# Patient Record
Sex: Female | Born: 1990 | Race: White | Hispanic: Yes | Marital: Single | Smoking: Current every day smoker
Health system: Southern US, Community
[De-identification: ages and names within clinical notes are randomized; demographics above are authoritative.]

## PROBLEM LIST (undated history)

## (undated) DIAGNOSIS — G43909 Migraine, unspecified, not intractable, without status migrainosus: Secondary | ICD-10-CM

## (undated) DIAGNOSIS — F32A Depression, unspecified: Secondary | ICD-10-CM

## (undated) DIAGNOSIS — F419 Anxiety disorder, unspecified: Secondary | ICD-10-CM

## (undated) DIAGNOSIS — F329 Major depressive disorder, single episode, unspecified: Secondary | ICD-10-CM

## (undated) HISTORY — PX: APPENDECTOMY: SHX54

## (undated) HISTORY — DX: Depression, unspecified: F32.A

## (undated) HISTORY — DX: Major depressive disorder, single episode, unspecified: F32.9

## (undated) HISTORY — DX: Anxiety disorder, unspecified: F41.9

## (undated) HISTORY — DX: Migraine, unspecified, not intractable, without status migrainosus: G43.909

---

## 2013-06-19 ENCOUNTER — Encounter: Payer: Self-pay | Admitting: Family Medicine

## 2013-06-19 ENCOUNTER — Ambulatory Visit (INDEPENDENT_AMBULATORY_CARE_PROVIDER_SITE_OTHER): Payer: 59 | Admitting: Family Medicine

## 2013-06-19 VITALS — Temp 98.1°F | Ht 65.75 in | Wt 130.0 lb

## 2013-06-19 DIAGNOSIS — Z23 Encounter for immunization: Secondary | ICD-10-CM

## 2013-06-19 DIAGNOSIS — G43909 Migraine, unspecified, not intractable, without status migrainosus: Secondary | ICD-10-CM

## 2013-06-19 DIAGNOSIS — Z7189 Other specified counseling: Secondary | ICD-10-CM

## 2013-06-19 DIAGNOSIS — F17200 Nicotine dependence, unspecified, uncomplicated: Secondary | ICD-10-CM

## 2013-06-19 DIAGNOSIS — Z7689 Persons encountering health services in other specified circumstances: Secondary | ICD-10-CM

## 2013-06-19 DIAGNOSIS — F341 Dysthymic disorder: Secondary | ICD-10-CM

## 2013-06-19 DIAGNOSIS — F329 Major depressive disorder, single episode, unspecified: Secondary | ICD-10-CM

## 2013-06-19 MED ORDER — SUMATRIPTAN SUCCINATE 25 MG PO TABS: ORAL_TABLET | ORAL | Status: DC

## 2013-06-19 NOTE — Patient Instructions (Signed)
-  PLEASE SIGN UP FOR MYCHART TODAY   We recommend the following healthy lifestyle measures: - eat a healthy diet consisting of lots of vegetables, fruits, beans, nuts, seeds, healthy meats such as white chicken and fish and whole grains.  - avoid fried foods, fast food, processed foods, sodas, red meet and other fattening foods.  - get a least 150 minutes of aerobic exercise per week.   -keep headache and trigger journal  -trial of medication for migraine -As we discussed, we have prescribed a new medication for you at this appointment. We discussed the common and serious potential adverse effects of this medication and you can review these and more with the pharmacist when you pick up your medication.  Please follow the instructions for use carefully and notify us immediately if you have any problems taking this medication.  -call to set up counseling  -quit smoking - try nicotine gum  Follow up in: late november

## 2013-06-19 NOTE — Progress Notes (Signed)
Chief Complaint  Patient presents with  . Establish Care  . migraines    HPI:  Cristina Flores is here to establish care. Recently moved here from [place].  Last PCP and physical: has been a while since she saw a doctor  Has the following chronic problems and concerns today:  1)Migraines: -has had these for years -gets these about 4 times per month -usually starts with odd sensation then R sided, sometimes with nausea, occ vomiting, sensitivity to light, touch and loud sounds -usually takes tylenol - doesn't take often - does help but she doesn't like to take medications -denies: fevers, chills, symptoms when not having migraines, has never taken migraine medication  2)Depression and Anxiety: -has struggled on and off with mood for a few years -depressed mood, anhedonia, tearful frequently, feels hopeless at time, poor focus at time, anxiety at times -wants to do counseling - never has SI or thoughts of self harm  3)Tobacco Use: -1 cig every 2-3 days -reasons to quit: thinks it is a nasty habit -relieves stress though  There are no active problems to display for this patient.  Health Maintenance: -wants flu shot today  ROS: See pertinent positives and negatives per HPI.  Past Medical History  Diagnosis Date  . Migraines   . Depression   . Anxiety     History reviewed. No pertinent family history.  History   Social History  . Marital Status: Single    Spouse Name: N/A    Number of Children: N/A  . Years of Education: N/A   Social History Main Topics  . Smoking status: Current Some Day Smoker    Types: Cigarettes  . Smokeless tobacco: None     Comment: per patient 1 cigarette every 2 to 3 days;   . Alcohol Use: No  . Drug Use: No  . Sexual Activity: None   Other Topics Concern  . None   Social History Narrative   Work or School: starting next semester - in transition      Home Situation: lives with boyfriend who is a Animal nutritionist  Beliefs: Christian      Lifestyle: no regular exercise; diet is healthy             Current outpatient prescriptions:SUMAtriptan (IMITREX) 25 MG tablet, 1 tab at onset of migraine. May repeat in 2 hours if headache persists or recurs. No more then 2 in 48 hours., Disp: 10 tablet, Rfl: 1  EXAM:  Filed Vitals:   06/19/13 1456  Temp: 98.1 F (36.7 C)    Body mass index is 21.14 kg/(m^2).  GENERAL: vitals reviewed and listed above, alert, oriented, appears well hydrated and in no acute distress  HEENT: atraumatic, conjunttiva clear, no obvious abnormalities on inspection of external nose and ears  NECK: no obvious masses on inspection  LUNGS: clear to auscultation bilaterally, no wheezes, rales or rhonchi, good air movement  CV: HRRR, no peripheral edema  MS: moves all extremities without noticeable abnormality  PSYCH: pleasant and cooperative, no obvious depression or anxiety  NEURO: PERRLA, CN II-XII groslly intact, gait normal, finger to nose normal  ASSESSMENT AND PLAN:  Discussed the following assessment and plan:  Encounter to establish care  Migraine - Plan: SUMAtriptan (IMITREX) 25 MG tablet  Tobacco use disorder  Anxiety and depression -We reviewed the PMH, PSH, FH, SH, Meds and Allergies. -We provided refills for any medications we will prescribe as needed. -We addressed current concerns per orders  and patient instructions. -We have asked for records for pertinent exams, studies, vaccines and notes from previous providers. -We have advised patient to follow up per instructions below. -flu vaccine given today -trial of triptan for migraines after discussion risks benefits and headache journal to identify triggeres -advised gentle exercise, counseling -smoking cessation counseling >3<10 minutes - she is going to try gum -follow up 1-2 months or sooner if needed   -Patient advised to return or notify a doctor immediately if symptoms worsen or persist or  new concerns arise.  Patient Instructions  -PLEASE SIGN UP FOR MYCHART TODAY   We recommend the following healthy lifestyle measures: - eat a healthy diet consisting of lots of vegetables, fruits, beans, nuts, seeds, healthy meats such as white chicken and fish and whole grains.  - avoid fried foods, fast food, processed foods, sodas, red meet and other fattening foods.  - get a least 150 minutes of aerobic exercise per week.   -keep headache and trigger journal  -trial of medication for migraine -As we discussed, we have prescribed a new medication for you at this appointment. We discussed the common and serious potential adverse effects of this medication and you can review these and more with the pharmacist when you pick up your medication.  Please follow the instructions for use carefully and notify us immediately if you have any problems taking this medication.  -call to set up counseling  -quit smoking - try nicotine gum  Follow up in: late november      Cristina Flores R.

## 2013-06-19 NOTE — Addendum Note (Signed)
Addended by: Azucena Freed on: 06/19/2013 03:35 PM   Modules accepted: Orders

## 2013-08-01 ENCOUNTER — Encounter: Payer: 59 | Admitting: Internal Medicine

## 2013-08-01 NOTE — Progress Notes (Signed)
Document opened and reviewed for OV but appt  canceled same day .  

## 2013-08-20 ENCOUNTER — Encounter: Payer: 59 | Admitting: Family Medicine

## 2013-08-20 DIAGNOSIS — Z0289 Encounter for other administrative examinations: Secondary | ICD-10-CM

## 2013-08-20 NOTE — Progress Notes (Signed)
Error   This encounter was created in error - please disregard. 

## 2014-04-11 ENCOUNTER — Emergency Department (HOSPITAL_COMMUNITY)
Admission: EM | Admit: 2014-04-11 | Discharge: 2014-04-11 | Disposition: A | Discharge status: Home / Self Care | Payer: 59 | Attending: Emergency Medicine | Admitting: Emergency Medicine

## 2014-04-11 ENCOUNTER — Emergency Department (HOSPITAL_COMMUNITY): Payer: 59

## 2014-04-11 ENCOUNTER — Encounter (HOSPITAL_COMMUNITY): Payer: Self-pay | Admitting: Emergency Medicine

## 2014-04-11 DIAGNOSIS — R0789 Other chest pain: Secondary | ICD-10-CM | POA: Insufficient documentation

## 2014-04-11 DIAGNOSIS — Z9089 Acquired absence of other organs: Secondary | ICD-10-CM | POA: Insufficient documentation

## 2014-04-11 DIAGNOSIS — Z791 Long term (current) use of non-steroidal anti-inflammatories (NSAID): Secondary | ICD-10-CM | POA: Insufficient documentation

## 2014-04-11 DIAGNOSIS — O26899 Other specified pregnancy related conditions, unspecified trimester: Secondary | ICD-10-CM

## 2014-04-11 DIAGNOSIS — O99330 Smoking (tobacco) complicating pregnancy, unspecified trimester: Secondary | ICD-10-CM | POA: Insufficient documentation

## 2014-04-11 DIAGNOSIS — O9989 Other specified diseases and conditions complicating pregnancy, childbirth and the puerperium: Secondary | ICD-10-CM | POA: Insufficient documentation

## 2014-04-11 DIAGNOSIS — R109 Unspecified abdominal pain: Secondary | ICD-10-CM | POA: Insufficient documentation

## 2014-04-11 DIAGNOSIS — O210 Mild hyperemesis gravidarum: Secondary | ICD-10-CM | POA: Insufficient documentation

## 2014-04-11 LAB — BASIC METABOLIC PANEL
ANION GAP: 14 (ref 5–15)
BUN: 10 mg/dL (ref 6–23)
CHLORIDE: 99 meq/L (ref 96–112)
CO2: 21 meq/L (ref 19–32)
CREATININE: 0.53 mg/dL (ref 0.50–1.10)
Calcium: 9.4 mg/dL (ref 8.4–10.5)
GFR calc Af Amer: >90 mL/min (ref >90)
GFR calc non Af Amer: >90 mL/min (ref >90)
Glucose, Bld: 99 mg/dL (ref 70–99)
POTASSIUM: 3.9 meq/L (ref 3.7–5.3)
Sodium: 134 mEq/L — ABNORMAL LOW (ref 137–147)

## 2014-04-11 LAB — CBC
HEMATOCRIT: 35.8 % — AB (ref 36.0–46.0)
HEMOGLOBIN: 12.1 g/dL (ref 12.0–15.0)
MCH: 27.6 pg (ref 26.0–34.0)
MCHC: 33.8 g/dL (ref 30.0–36.0)
MCV: 81.5 fL (ref 78.0–100.0)
PLATELETS: 296 10*3/uL (ref 150–400)
RBC: 4.39 MIL/uL (ref 3.87–5.11)
RDW: 13.8 % (ref 11.5–15.5)
WBC: 8.8 10*3/uL (ref 4.0–10.5)

## 2014-04-11 LAB — HIV ANTIBODY (ROUTINE TESTING W REFLEX): HIV 1&2 Ab, 4th Generation: NONREACTIVE

## 2014-04-11 LAB — GC/CHLAMYDIA PROBE AMP
CT PROBE, AMP APTIMA: NEGATIVE
GC Probe RNA: NEGATIVE

## 2014-04-11 LAB — HCG, QUANTITATIVE, PREGNANCY: hCG, Beta Chain, Quant, S: 105574 m[IU]/mL — ABNORMAL HIGH (ref <5)

## 2014-04-11 LAB — URINALYSIS, ROUTINE W REFLEX MICROSCOPIC
Bilirubin Urine: NEGATIVE
Glucose, UA: NEGATIVE mg/dL
HGB URINE DIPSTICK: NEGATIVE
Ketones, ur: NEGATIVE mg/dL
Leukocytes, UA: NEGATIVE
Nitrite: NEGATIVE
PROTEIN: NEGATIVE mg/dL
Specific Gravity, Urine: 1.024 (ref 1.005–1.030)
UROBILINOGEN UA: 0.2 mg/dL (ref 0.0–1.0)
pH: 7 (ref 5.0–8.0)

## 2014-04-11 LAB — I-STAT TROPONIN, ED: Troponin i, poc: 0 ng/mL (ref 0.00–0.08)

## 2014-04-11 LAB — WET PREP, GENITAL
Clue Cells Wet Prep HPF POC: NONE SEEN
Trich, Wet Prep: NONE SEEN
Yeast Wet Prep HPF POC: NONE SEEN

## 2014-04-11 LAB — POC URINE PREG, ED: Preg Test, Ur: POSITIVE — AB

## 2014-04-11 MED ORDER — ACETAMINOPHEN 325 MG PO TABS: 650.0000 mg | ORAL_TABLET | Freq: Once | ORAL | Status: DC

## 2014-04-11 NOTE — ED Notes (Signed)
EKG given to EDP, Docherty,MD. For review. 

## 2014-04-11 NOTE — ED Notes (Signed)
Pt states she is [redacted] weeks pregnant. States she began having intermittent left sided chest pain since around 1600. Pain radiates to the back of her neck and down her left arm. Pt also c/o n/v and abd pain. Denies diaphoresis, SOB, dizziness or LOC.

## 2014-04-11 NOTE — Discharge Instructions (Signed)
Chest Pain (Nonspecific) °It is often hard to give a diagnosis for the cause of chest pain. There is always a chance that your pain could be related to something serious, such as a heart attack or a blood clot in the lungs. You need to follow up with your doctor. °HOME CARE °· If antibiotic medicine was given, take it as directed by your doctor. Finish the medicine even if you start to feel better. °· For the next few days, avoid activities that bring on chest pain. Continue physical activities as told by your doctor. °· Do not use any tobacco products. This includes cigarettes, chewing tobacco, and e-cigarettes. °· Avoid drinking alcohol. °· Only take medicine as told by your doctor. °· Follow your doctor's suggestions for more testing if your chest pain does not go away. °· Keep all doctor visits you made. °GET HELP IF: °· Your chest pain does not go away, even after treatment. °· You have a rash with blisters on your chest. °· You have a fever. °GET HELP RIGHT AWAY IF:  °· You have more pain or pain that spreads to your arm, neck, jaw, back, or belly (abdomen). °· You have shortness of breath. °· You cough more than usual or cough up blood. °· You have very bad back or belly pain. °· You feel sick to your stomach (nauseous) or throw up (vomit). °· You have very bad weakness. °· You pass out (faint). °· You have chills. °This is an emergency. Do not wait to see if the problems will go away. Call your local emergency services (911 in [place] Do not drive yourself to the hospital. °MAKE SURE YOU:  °· Understand these instructions. °· Will watch your condition. °· Will get help right away if you are not doing well or get worse. °Document Released: 03/08/2008 Document Revised: 09/25/2013 Document Reviewed: 03/08/2008 °ExitCare® Patient Information ©2015 [employer]. This information is not intended to replace advice given to you by your health care provider. Make sure you discuss any questions you have with your  health care provider. °Abdominal Pain During Pregnancy °Belly (abdominal) pain is common during pregnancy. Most of the time, it is not a serious problem. Other times, it can be a sign that something is wrong with the pregnancy. Always tell your doctor if you have belly pain. °HOME CARE °Monitor your belly pain for any changes. The following actions may help you feel better: °· Do not have sex (intercourse) or put anything in your vagina until you feel better. °· Rest until your pain stops. °· Drink clear fluids if you feel sick to your stomach (nauseous). Do not eat solid food until you feel better. °· Only take medicine as told by your doctor. °· Keep all doctor visits as told. °GET HELP RIGHT AWAY IF:  °· You are bleeding, leaking fluid, or pieces of tissue come out of your vagina. °· You have more pain or cramping. °· You keep throwing up (vomiting). °· You have pain when you pee (urinate) or have blood in your pee. °· You have a fever. °· You do not feel your baby moving as much. °· You feel very weak or feel like passing out. °· You have trouble breathing, with or without belly pain. °· You have a very bad headache and belly pain. °· You have fluid leaking from your vagina and belly pain. °· You keep having watery poop (diarrhea). °· Your belly pain does not go away after resting, or the pain gets   worse. MAKE SURE YOU:   Understand these instructions.  Will watch your condition.  Will get help right away if you are not doing well or get worse. Document Released: 09/08/2009 Document Revised: 05/23/2013 Document Reviewed: 04/19/2013 [facility] Patient Information 2015 [place], [place]. This information is not intended to replace advice given to you by your health care provider. Make sure you discuss any questions you have with your health care provider.

## 2014-04-11 NOTE — ED Provider Notes (Signed)
CSN: [id number]     Arrival date & time 04/11/14  0220 History   First MD Initiated Contact with Patient 04/11/14 0248     Chief Complaint  Patient presents with  . Chest Pain  . Abdominal Pain     (Consider location/radiation/quality/duration/timing/severity/associated sxs/prior Treatment) Patient is a 23 y.o. female presenting with chest pain and abdominal pain. The history is provided by the patient. No language interpreter was used.  Chest Pain Pain location:  L chest Pain quality: sharp   Pain radiates to:  L arm Pain radiates to the back: no   Pain severity:  Moderate Onset quality:  Sudden Duration:  11 hours Timing:  Intermittent Progression:  Waxing and waning Chronicity:  New Context: at rest   Relieved by: pressure. Worsened by:  Nothing tried Ineffective treatments:  None tried Associated symptoms: abdominal pain, nausea and vomiting   Associated symptoms: no back pain, no cough, no diaphoresis, no fatigue, no fever, no headache, no numbness, no palpitations, no shortness of breath and no weakness   Abdominal pain:    Location:  Suprapubic   Severity:  Moderate   Duration:  4 weeks   Timing:  Intermittent   Progression:  Waxing and waning   Chronicity:  New Nausea:    Timing:  Intermittent   Progression:  Waxing and waning Vomiting:    Timing:  Intermittent   Progression:  Unchanged Risk factors: pregnancy   Risk factors: no birth control   Abdominal Pain Associated symptoms: chest pain, nausea and vomiting   Associated symptoms: no chills, no cough, no diarrhea, no dysuria, no fatigue, no fever, no shortness of breath and no sore throat     History reviewed. No pertinent past medical history. Past Surgical History  Procedure Laterality Date  . Appendectomy     History reviewed. No pertinent family history. History  Substance Use Topics  . Smoking status: Current Every Day Smoker -- 0.10 packs/day    Types: Cigarettes  . Smokeless tobacco: Never  Used  . Alcohol Use: Yes     Comment: occasionally   OB History   Grav Para Term Preterm Abortions TAB SAB Ect Mult Living   2    1          Review of Systems  Constitutional: Negative for fever, chills, diaphoresis, activity change, appetite change and fatigue.  HENT: Negative for congestion, facial swelling, rhinorrhea and sore throat.   Eyes: Negative for photophobia and discharge.  Respiratory: Negative for cough, chest tightness and shortness of breath.   Cardiovascular: Positive for chest pain. Negative for palpitations and leg swelling.  Gastrointestinal: Positive for nausea, vomiting and abdominal pain. Negative for diarrhea.  Endocrine: Negative for polydipsia and polyuria.  Genitourinary: Negative for dysuria, frequency, difficulty urinating and pelvic pain.  Musculoskeletal: Negative for arthralgias, back pain, neck pain and neck stiffness.  Skin: Negative for color change and wound.  Allergic/Immunologic: Negative for immunocompromised state.  Neurological: Negative for facial asymmetry, weakness, numbness and headaches.  Hematological: Does not bruise/bleed easily.  Psychiatric/Behavioral: Negative for confusion and agitation.      Allergies  Review of patient's allergies indicates no known allergies.  Home Medications   Prior to Admission medications   Medication Sig Start Date End Date Taking? Authorizing Provider  acetaminophen (TYLENOL) 500 MG tablet Take 1,000 mg by mouth every 6 (six) hours as needed for mild pain.   Yes Historical Provider, MD  ibuprofen (ADVIL,MOTRIN) 200 MG tablet Take 400 mg by mouth every  6 (six) hours as needed for moderate pain.   Yes Historical Provider, MD   BP 122/77  Pulse 100  Temp(Src) 99.3 F (37.4 C) (Oral)  Resp 17  SpO2 100%  LMP 02/20/2014 Physical Exam  Constitutional: She is oriented to person, place, and time. She appears well-developed and well-nourished. No distress.  HENT:  Head: Normocephalic and atraumatic.   Mouth/Throat: No oropharyngeal exudate.  Eyes: Pupils are equal, round, and reactive to light.  Neck: Normal range of motion. Neck supple.  Cardiovascular: Normal rate, regular rhythm and normal heart sounds.  Exam reveals no gallop and no friction rub.   No murmur heard. Pulmonary/Chest: Effort normal and breath sounds normal. No respiratory distress. She has no wheezes. She has no rales.  Abdominal: Soft. Bowel sounds are normal. She exhibits no distension and no mass. There is tenderness in the suprapubic area. There is no rigidity, no rebound and no guarding.  Musculoskeletal: Normal range of motion. She exhibits no edema and no tenderness.  Neurological: She is alert and oriented to person, place, and time.  Skin: Skin is warm and dry.  Psychiatric: She has a normal mood and affect.    ED Course  Procedures (including critical care time) Labs Review Labs Reviewed  WET PREP, GENITAL - Abnormal; Notable for the following:    WBC, Wet Prep HPF POC MANY (*)    All other components within normal limits  BASIC METABOLIC PANEL - Abnormal; Notable for the following:    Sodium 134 (*)    All other components within normal limits  CBC - Abnormal; Notable for the following:    HCT 35.8 (*)    All other components within normal limits  URINALYSIS, ROUTINE W REFLEX MICROSCOPIC - Abnormal; Notable for the following:    APPearance CLOUDY (*)    All other components within normal limits  HCG, QUANTITATIVE, PREGNANCY - Abnormal; Notable for the following:    hCG, Beta Chain, Quant, S [id number] (*)    All other components within normal limits  POC URINE PREG, ED - Abnormal; Notable for the following:    Preg Test, Ur POSITIVE (*)    All other components within normal limits  GC/CHLAMYDIA PROBE AMP  HIV ANTIBODY (ROUTINE TESTING)  I-STAT TROPOININ, ED    Imaging Review US Ob Comp Less 14 Wks  04/11/2014   CLINICAL DATA:  Abdominal pain and cramping. Unknown LMP. Quantitative beta HCG is  105,574  EXAM: OBSTETRIC <14 WK [place] AND TRANSVAGINAL OB US  TECHNIQUE: Both transabdominal and transvaginal ultrasound examinations were performed for complete evaluation of the gestation as well as the maternal uterus, adnexal regions, and pelvic cul-de-sac. Transvaginal technique was performed to assess early pregnancy.  COMPARISON:  None.  FINDINGS: Intrauterine gestational sac: A single intrauterine pregnancy is visualized.  Yolk sac:  Yolk sac is present.  Embryo:  Fetal pole is present.  Cardiac Activity: Fetal cardiac activity is observed.  Heart Rate:  165 bpm  CRL:   19.8  mm   8 w 4 d                  [place] EDC: 11/17/2014  Maternal uterus/adnexae: Uterus is anteverted, measuring 9.5 x 5.5 x 7.5 cm. No myometrial mass lesions. No subchorionic hemorrhage. Right ovary is visualized and appears normal. Left ovary is not identified. No free pelvic fluid collections.  IMPRESSION: Single intrauterine pregnancy. Estimated gestational age by crown-rump length is 8 weeks 4 days. No acute complication is suggested.  Electronically Signed   By: Burman NievesWilliam  Stevens M.D.   On: 04/11/2014 04:52   [place] Ob Transvaginal  04/11/2014   CLINICAL DATA:  Abdominal pain and cramping. Unknown LMP. Quantitative beta HCG is 105,574  EXAM: OBSTETRIC <14 WK US AND TRANSVAGINAL OB US  TECHNIQUE: Both transabdominal and transvaginal ultrasound examinations were performed for complete evaluation of the gestation as well as the maternal uterus, adnexal regions, and pelvic cul-de-sac. Transvaginal technique was performed to assess early pregnancy.  COMPARISON:  None.  FINDINGS: Intrauterine gestational sac: A single intrauterine pregnancy is visualized.  Yolk sac:  Yolk sac is present.  Embryo:  Fetal pole is present.  Cardiac Activity: Fetal cardiac activity is observed.  Heart Rate:  165 bpm  CRL:   19.8  mm   8 w 4 d                  US EDC: 11/17/2014  Maternal uterus/adnexae: Uterus is anteverted, measuring 9.5 x 5.5 x 7.5 cm. No  myometrial mass lesions. No subchorionic hemorrhage. Right ovary is visualized and appears normal. Left ovary is not identified. No free pelvic fluid collections.  IMPRESSION: Single intrauterine pregnancy. Estimated gestational age by crown-rump length is 8 weeks 4 days. No acute complication is suggested.   Electronically Signed   By: Burman NievesWilliam  Stevens M.D.   On: 04/11/2014 04:52     EKG Interpretation None      Date: 04/11/2014  Rate: 96  Rhythm: normal sinus rhythm  QRS Axis: normal  Intervals: borderline PR shortening, prolonged QT interval  ST/T Wave abnormalities: normal  Conduction Disutrbances:none  Narrative Interpretation:   Old EKG Reviewed: none available    MDM   Final diagnoses:  Atypical chest pain  Abdominal pain during pregnancy    Pt is a 23 y.o. female with Pmhx as above who presents with about 11 hrs of intermittent L sided CP with radiation down L arm. NO SOB, fever, cough, leg pain/swelling. She reports +chills.  She has also been having low abdominal cramping for several weeks. She is approx [redacted]wk pregnant, has not yet had an US. She had vaginal bleeding about 8 wks ago after taking plan B, but states she then had a lot of vomiting after taking the pill. Denies current vaginal bleeding or d/c. On PE, she has low grade temp, +low abdominal ppt w/o rebound or guarding. She reports CP improved w/ palpation of chest wall. Lungs clear. No LE edema. OB US w/ [redacted]W[redacted]D preg, nml appearing. UA nml. CBC, BMP grossly unremarkable. Wet prep w/ many WBCs. GC/Chlam sent. I do not feel CP likely to be cardiogenic cause given age, lack of risk factors, neg trop and EKG findings. Will get scheduled tylenol for pain as well as outpt OB f/u.         Shanna CiscoMegan E Cintya Daughety, MD 04/11/14 [id number]

## 2014-07-06 ENCOUNTER — Encounter (HOSPITAL_COMMUNITY): Payer: Self-pay | Admitting: Emergency Medicine

## 2014-07-06 ENCOUNTER — Emergency Department (HOSPITAL_COMMUNITY): Payer: 59

## 2014-07-06 ENCOUNTER — Emergency Department (HOSPITAL_COMMUNITY)
Admission: EM | Admit: 2014-07-06 | Discharge: 2014-07-06 | Disposition: A | Discharge status: Home / Self Care | Payer: 59 | Attending: Emergency Medicine | Admitting: Emergency Medicine

## 2014-07-06 DIAGNOSIS — M6789 Other specified disorders of synovium and tendon, multiple sites: Secondary | ICD-10-CM

## 2014-07-06 DIAGNOSIS — Z8679 Personal history of other diseases of the circulatory system: Secondary | ICD-10-CM | POA: Diagnosis not present

## 2014-07-06 DIAGNOSIS — Y939 Activity, unspecified: Secondary | ICD-10-CM | POA: Insufficient documentation

## 2014-07-06 DIAGNOSIS — Z23 Encounter for immunization: Secondary | ICD-10-CM | POA: Diagnosis not present

## 2014-07-06 DIAGNOSIS — W458XXA Other foreign body or object entering through skin, initial encounter: Secondary | ICD-10-CM | POA: Insufficient documentation

## 2014-07-06 DIAGNOSIS — IMO0002 Reserved for concepts with insufficient information to code with codable children: Secondary | ICD-10-CM

## 2014-07-06 DIAGNOSIS — Z8659 Personal history of other mental and behavioral disorders: Secondary | ICD-10-CM | POA: Diagnosis not present

## 2014-07-06 DIAGNOSIS — Z720 Tobacco use: Secondary | ICD-10-CM | POA: Diagnosis not present

## 2014-07-06 DIAGNOSIS — S91311A Laceration without foreign body, right foot, initial encounter: Secondary | ICD-10-CM | POA: Diagnosis not present

## 2014-07-06 DIAGNOSIS — Y929 Unspecified place or not applicable: Secondary | ICD-10-CM | POA: Diagnosis not present

## 2014-07-06 MED ORDER — TETANUS-DIPHTH-ACELL PERTUSSIS 5-2.5-18.5 LF-MCG/0.5 IM SUSP
0.5000 mL | Freq: Once | INTRAMUSCULAR | Status: AC
  Administered 2014-07-06: 0.5 mL via INTRAMUSCULAR
  Filled 2014-07-06 (×2): qty 0.5

## 2014-07-06 MED ORDER — LIDOCAINE-EPINEPHRINE 1 %-1:100000 IJ SOLN
10.0000 mL | Freq: Once | INTRAMUSCULAR | Status: DC
  Filled 2014-07-06: qty 10

## 2014-07-06 MED ORDER — LIDOCAINE HCL 1 % IJ SOLN
30.0000 mL | Freq: Once | INTRAMUSCULAR | Status: AC
  Administered 2014-07-06: 30 mL via INTRADERMAL
  Filled 2014-07-06: qty 40

## 2014-07-06 MED ORDER — IBUPROFEN 200 MG PO TABS
600.0000 mg | ORAL_TABLET | Freq: Once | ORAL | Status: AC
  Administered 2014-07-06: 600 mg via ORAL
  Filled 2014-07-06: qty 3

## 2014-07-06 NOTE — ED Notes (Signed)
Pt back from x-ray.

## 2014-07-06 NOTE — ED Notes (Signed)
PA at bedside.

## 2014-07-06 NOTE — ED Notes (Signed)
Pt to xray

## 2014-07-06 NOTE — ED Notes (Signed)
Pt refusing crutches and post op boot. Pt angry because she told tech she had 6 minutes before she had to leave. rn in provider room as discharge papers were being printed out. rn brought discharge papers immediately to pt. At same time tech in room with crutches and post op boot. Pt multiple times refused to wait for instructions about crutches or boot. 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 Pt angry that she would have to wait in lobby for her ride.   Pt escorted to discharge window. Pt verbalized understanding discharge instructions about follow up with ortho surgeon. In no acute distress.

## 2014-07-06 NOTE — ED Notes (Signed)
Pt arrived to the ED with a complaint of a laceration to the Right leg.  Pt states a mirror frll and broke on her leg.  Pt has one laceration on her distal calf approx. 4cm in length.  Pt has a second laceration 2cm in length on the top of the right foot

## 2014-07-06 NOTE — ED Provider Notes (Signed)
History of Present Illness   Patient Identification Cristina DecentMarisabel Bognar is a 23 y.o. female.  Patient information was obtained from patient. History/Exam limitations: none. Patient presented to the Emergency Department by private vehicle.  Chief Complaint  Extremity Laceration   Patient presents for evaluation of a laceration to the right foot and lower leg. Injury occurred 1 hour ago. The mechanism of the wound was a broken glass, broken mirror. The patient reports pain in the 2 laceration areas. There were not other injuries.  Patient denies head injury, loss of consciousness, neck pain, chest pain, abdominal pain, numbness and weakness. The tetanus status is unknown.  Past Medical History  Diagnosis Date  . Migraines   . Depression   . Anxiety    History reviewed. No pertinent family history. Current Facility-Administered Medications  Medication Dose Route Frequency Provider Last Rate Last Dose  . Tdap (BOOSTRIX) injection 0.5 mL  0.5 mL Intramuscular Once Arthor CaptainAbigail Kathrin Folden, PA-C       No current outpatient prescriptions on file.   No Known Allergies History   Social History  . Marital Status: Single    Spouse Name: N/A    Number of Children: N/A  . Years of Education: N/A   Occupational History  . Not on file.   Social History Main Topics  . Smoking status: Current Some Day Smoker    Types: Cigarettes  . Smokeless tobacco: Not on file     Comment: per patient 1 cigarette every 2 to 3 days;   . Alcohol Use: No  . Drug Use: No  . Sexual Activity: Not on file   Other Topics Concern  . Not on file   Social History Narrative   Work or School: starting next semester - in transition      Home Situation: lives with boyfriend who is a Animal nutritionistpilot      Spiritual Beliefs: Christian      Lifestyle: no regular exercise; diet is healthy            Review of Systems A comprehensive review of systems was negative except for: wounds to R LE and foot   Physical Exam    BP 134/75  Pulse 115  Temp(Src) 97.9 F (36.6 C) (Oral)  Resp 18  Ht 5\' 5"  (1.651 m)  Wt 125 lb (56.7 kg)  BMI 20.80 kg/m2  SpO2 100%  LMP 06/22/2014 [facility] Female in NAD, tearful and anxious Head: Normocephalic atraumatic  HEENT: EOMI, PERRL, Neck is supple Lungs: normal effort CV: RRR Abdomen: Soft, nondistended Neuro: A&Ox3, GCS 15 Psych: Normal behavior Skin: Warm and dry MSK: R 4TH TOE in greater flexion than other toes. Unable to extend against resistance. DP intact  There is a linear laceration measuring approximately 6 cm in length on the distal, lateral lower leg.There is a linear laceration measuring approximately 3 cm in length on the proximal 4th toe. Examination of the wound for foreign bodies and devitalized tissue showed none.  Examination of the surrounding area for neural or vascular damage showed none      ED Course   Studies: Imaging: X-ray: Extremities: Tib-Fib: Normal Foot: Negative  Records Reviewed: Nursing notes.  Treatments: Ibuprofen given. LACERATION REPAIR Performed by: Arthor CaptainHarris, Sharmila Wrobleski Authorized by: Arthor CaptainHarris, Airam Heidecker Consent: Verbal consent obtained. Risks and benefits: risks, benefits and alternatives were discussed Consent given by: patient Patient identity confirmed: provided demographic data Prepped and Draped in normal sterile fashion Wound explored  Laceration Location: proximal to 4th toe  Laceration Length: 3cm  No Foreign Bodies seen or palpated  Anesthesia: local infiltration  Local anesthetic: lidocaine 1% w/o epinephrine  Anesthetic total: 4 ml  Irrigation method: syringe Amount of cleaning: standard  Skin closure: 5.0 prolene  Number of sutures: 7  Technique: RI  Patient tolerance: Patient tolerated the procedure well with no immediate complications.  LACERATION REPAIR Performed by: Arthor Captain Authorized by: Arthor Captain Consent: Verbal consent obtained. Risks and benefits: risks, benefits and  alternatives were discussed Consent given by: patient Patient identity confirmed: provided demographic data Prepped and Draped in normal sterile fashion Wound explored  Laceration Location: R leg  Laceration Length: 6 cm  No Foreign Bodies seen or palpated  Anesthesia: local infiltration  Local anesthetic: lidocaine 1% w/o epinephrine  Anesthetic total: 6 ml  Irrigation method: syringe Amount of cleaning: standard  Skin closure: 5.0 prolene  Number of sutures: 7  Technique: horizontal pulley, SI  Patient tolerance: Patient tolerated the procedure well with no immediate complications.  Tetanus immunization. Wound cleansed. Wound dressing applied. Crutches, post op shoe  Consultations: Orthopedic Surgery consulted. Treatment options were discussed and plan of care agreed upon.  Disposition: Home Tylenol and Referral Orthopedics Condition improved The patient was given verbal/written follow-up and wound care instructions  Arthor Captain, PA-C 07/06/14 0945

## 2014-07-06 NOTE — ED Notes (Signed)
PA at bedside Pt alert and oriented x4. Respirations even and unlabored, bilateral symmetrical rise and fall of chest. Skin warm and dry. In no acute distress. Denies needs.   

## 2014-07-06 NOTE — Discharge Instructions (Signed)
Use the crutches for comfort only. Read directions for wound care. Return for signs of infection as directed below. Sutures will need to be removed in 7-10 days.  Laceration Care, Adult A laceration is a cut or lesion that goes through all layers of the skin and into the tissue just beneath the skin. TREATMENT  Some lacerations may not require closure. Some lacerations may not be able to be closed due to an increased risk of infection. It is important to see your caregiver as soon as possible after an injury to minimize the risk of infection and maximize the opportunity for successful closure. If closure is appropriate, pain medicines may be given, if needed. The wound will be cleaned to help prevent infection. Your caregiver will use stitches (sutures), staples, wound glue (adhesive), or skin adhesive strips to repair the laceration. These tools bring the skin edges together to allow for faster healing and a better cosmetic outcome. However, all wounds will heal with a scar. Once the wound has healed, scarring can be minimized by covering the wound with sunscreen during the day for 1 full year. HOME CARE INSTRUCTIONS  For sutures or staples:  Keep the wound clean and dry.  If you were given a bandage (dressing), you should change it at least once a day. Also, change the dressing if it becomes wet or dirty, or as directed by your caregiver.  Wash the wound with soap and water 2 times a day. Rinse the wound off with water to remove all soap. Pat the wound dry with a clean towel.  After cleaning, apply a thin layer of the antibiotic ointment as recommended by your caregiver. This will help prevent infection and keep the dressing from sticking.  You may shower as usual after the first 24 hours. Do not soak the wound in water until the sutures are removed.  Only take over-the-counter or prescription medicines for pain, discomfort, or fever as directed by your caregiver.  Get your sutures or  staples removed as directed by your caregiver. For skin adhesive strips:  Keep the wound clean and dry.  Do not get the skin adhesive strips wet. You may bathe carefully, using caution to keep the wound dry.  If the wound gets wet, pat it dry with a clean towel.  Skin adhesive strips will fall off on their own. You may trim the strips as the wound heals. Do not remove skin adhesive strips that are still stuck to the wound. They will fall off in time. For wound adhesive:  You may briefly wet your wound in the shower or bath. Do not soak or scrub the wound. Do not swim. Avoid periods of heavy perspiration until the skin adhesive has fallen off on its own. After showering or bathing, gently pat the wound dry with a clean towel.  Do not apply liquid medicine, cream medicine, or ointment medicine to your wound while the skin adhesive is in place. This may loosen the film before your wound is healed.  If a dressing is placed over the wound, be careful not to apply tape directly over the skin adhesive. This may cause the adhesive to be pulled off before the wound is healed.  Avoid prolonged exposure to sunlight or tanning lamps while the skin adhesive is in place. Exposure to ultraviolet light in the first year will darken the scar.  The skin adhesive will usually remain in place for 5 to 10 days, then naturally fall off the skin. Do not pick  at the adhesive film. You may need a tetanus shot if:  You cannot remember when you had your last tetanus shot.  You have never had a tetanus shot. If you get a tetanus shot, your arm may swell, get red, and feel warm to the touch. This is common and not a problem. If you need a tetanus shot and you choose not to have one, there is a rare chance of getting tetanus. Sickness from tetanus can be serious. SEEK MEDICAL CARE IF:   You have redness, swelling, or increasing pain in the wound.  You see a red line that goes away from the wound.  You have  yellowish-white fluid (pus) coming from the wound.  You have a fever.  You notice a bad smell coming from the wound or dressing.  Your wound breaks open before or after sutures have been removed.  You notice something coming out of the wound such as wood or glass.  Your wound is on your hand or foot and you cannot move a finger or toe. SEEK IMMEDIATE MEDICAL CARE IF:   Your pain is not controlled with prescribed medicine.  You have severe swelling around the wound causing pain and numbness or a change in color in your arm, hand, leg, or foot.  Your wound splits open and starts bleeding.  You have worsening numbness, weakness, or loss of function of any joint around or beyond the wound.  You develop painful lumps near the wound or on the skin anywhere on your body. MAKE SURE YOU:   Understand these instructions.  Will watch your condition.  Will get help right away if you are not doing well or get worse. Document Released: 09/20/2005 Document Revised: 12/13/2011 Document Reviewed: 03/16/2011 [facility] Patient Information 2015 [employer] [place]. This information is not intended to replace advice given to you by your health care provider. Make sure you discuss any questions you have with your health care provider.

## 2014-07-06 NOTE — ED Provider Notes (Addendum)
Medical screening examination/treatment/procedure(s) were conducted as a shared visit with non-physician practitioner(s) and myself.  I personally evaluated the patient during the encounter.   EKG Interpretation None       Arby BarretteMarcy Keishaun Hazel, MD 07/06/14 1056  Patient sustained 2 lacerations from a broken mirror.  I have examined and explored the laceration the patient's foot to identify a tendon laceration as illustrated in the photograph.  Laceration has been repaired by physician's Asst. see initial note and plan will be for referral to orthopedics.  Arby BarretteMarcy Reneisha Stilley, MD 07/23/14 [id number]

## 2014-08-06 ENCOUNTER — Encounter (HOSPITAL_COMMUNITY): Payer: Self-pay | Admitting: Emergency Medicine

## 2014-10-18 ENCOUNTER — Encounter (HOSPITAL_COMMUNITY): Payer: Self-pay | Admitting: Emergency Medicine

## 2015-07-05 IMAGING — CR DG FOOT COMPLETE 3+V*R*
3 series · 3 of 3 positions shown · non-contrast
Comparison: Right tibia and fibular radiographs -earlier same day

CLINICAL DATA: Laceration to distal third and fourth metatarsals.
Evaluate for radiopaque foreign body. Initial encounter.

EXAM:
RIGHT FOOT COMPLETE - 3+ VIEW

[x foot ap right]
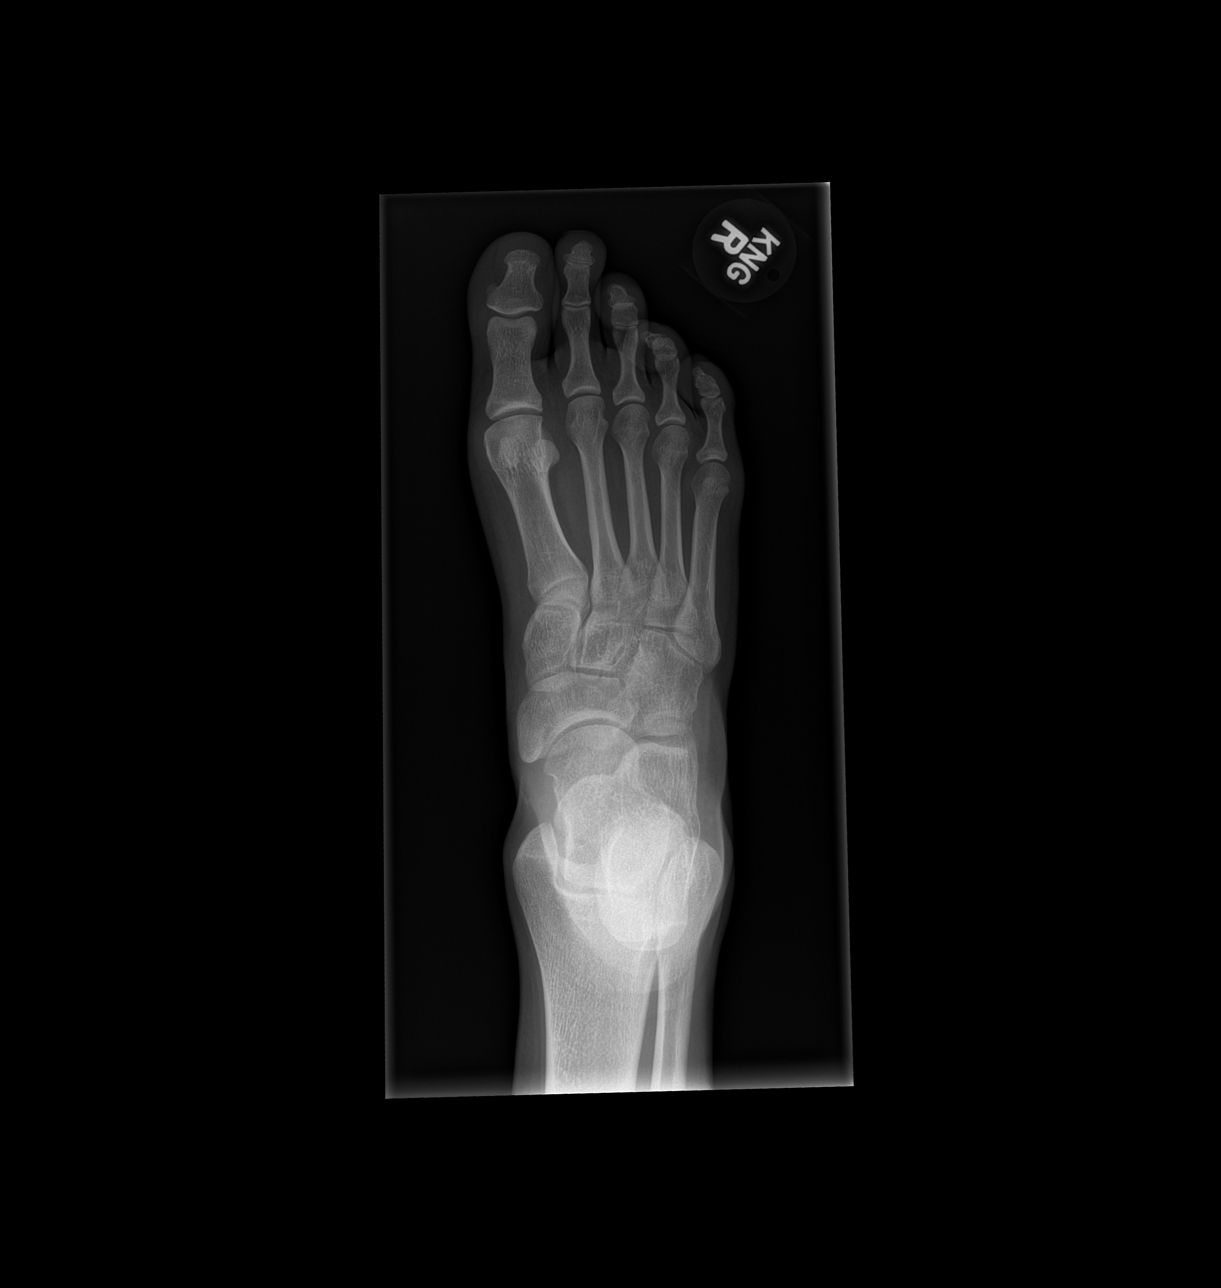

[x foot obl right]
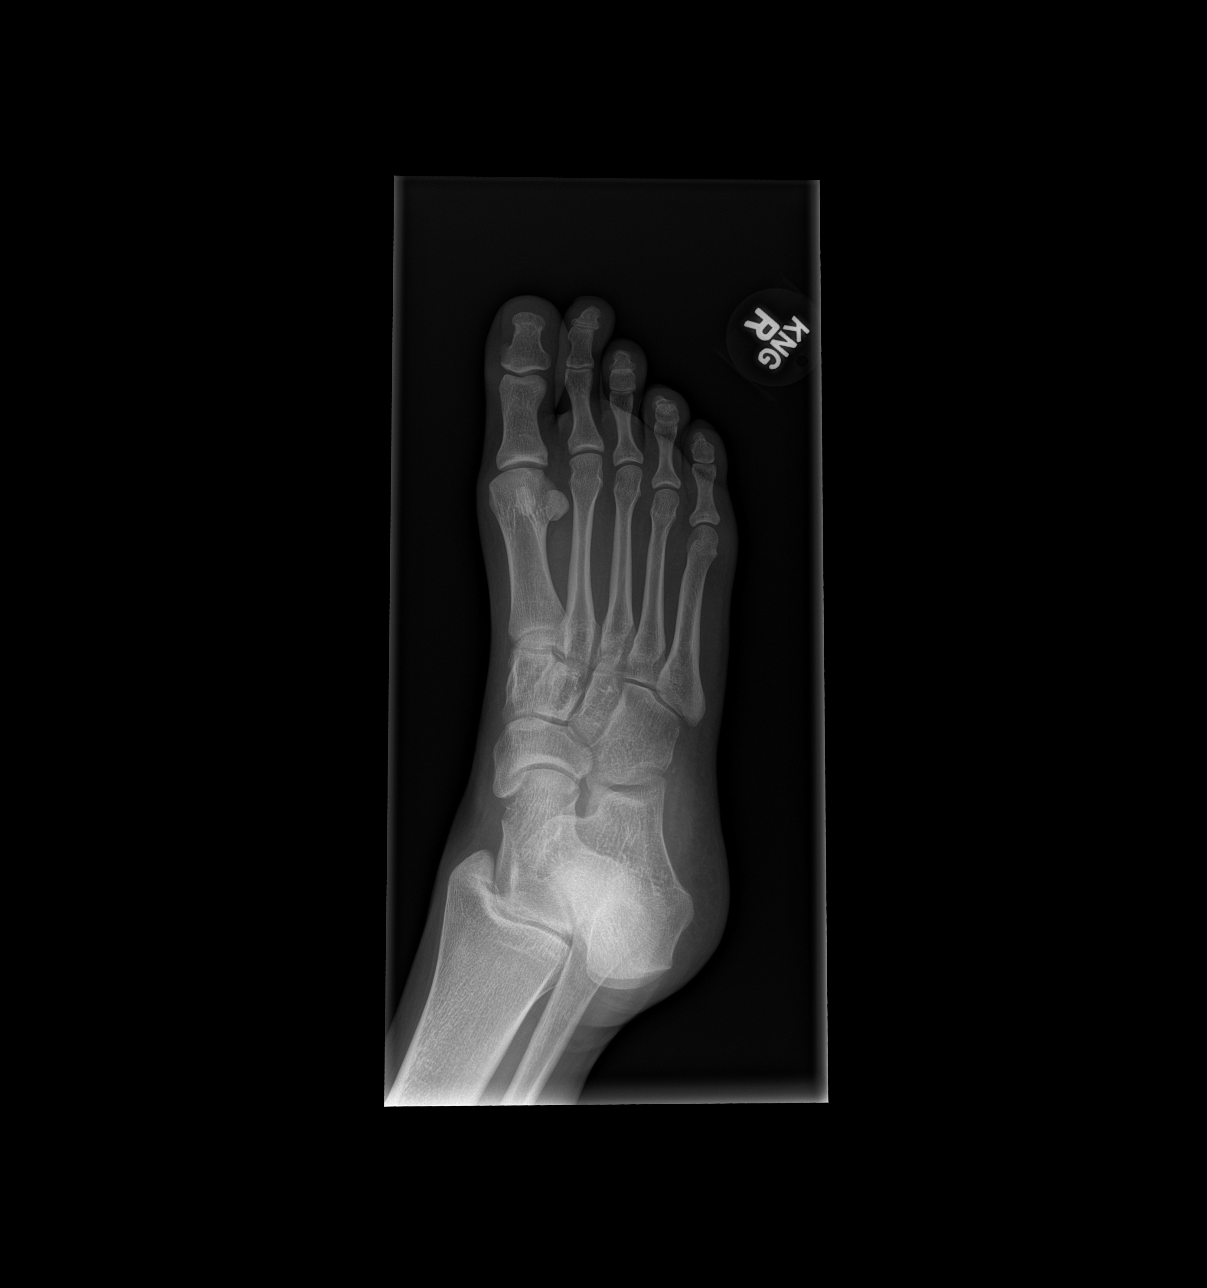

[x foot lat right]
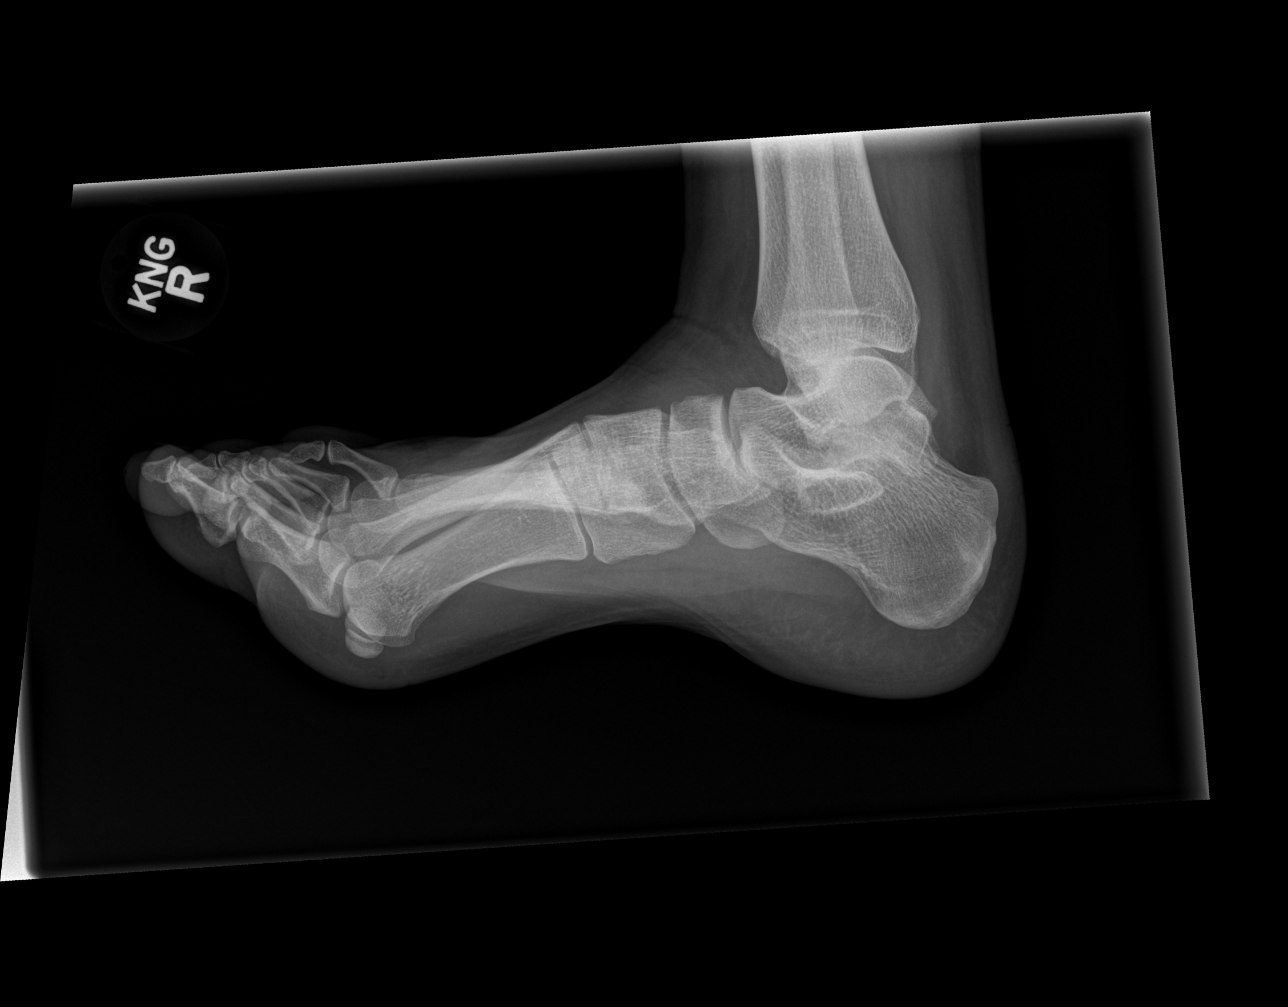

[3 of 3 positions shown; findings below may reference images not displayed]

FINDINGS: There is an apparent soft tissue laceration about the dorsum of the
forefoot, best appreciated on the provided lateral radiograph. This
finding is without associated radiopaque foreign body or displaced
fracture. Joint spaces are preserved. No erosions.
IMPRESSION: Apparent soft tissue laceration about the dorsum of the foot without
associated radiopaque foreign body or displaced fracture.
